# Patient Record
Sex: Female | Born: 1955 | Race: White | Hispanic: No | Smoking: Former smoker
Health system: Southern US, Community
[De-identification: ages and names within clinical notes are randomized; demographics above are authoritative.]

## PROBLEM LIST (undated history)

## (undated) DIAGNOSIS — M069 Rheumatoid arthritis, unspecified: Secondary | ICD-10-CM

## (undated) DIAGNOSIS — J449 Chronic obstructive pulmonary disease, unspecified: Secondary | ICD-10-CM

## (undated) DIAGNOSIS — I10 Essential (primary) hypertension: Secondary | ICD-10-CM

## (undated) HISTORY — PX: TONSILLECTOMY: SUR1361

## (undated) HISTORY — PX: ADENOIDECTOMY: SUR15

---

## 2011-02-16 DIAGNOSIS — I517 Cardiomegaly: Secondary | ICD-10-CM

## 2012-02-07 ENCOUNTER — Emergency Department (HOSPITAL_COMMUNITY)
Admission: EM | Admit: 2012-02-07 | Discharge: 2012-02-07 | Disposition: A | Discharge status: Home / Self Care | Payer: 59 | Attending: Emergency Medicine | Admitting: Emergency Medicine

## 2012-02-07 ENCOUNTER — Encounter (HOSPITAL_COMMUNITY): Payer: Self-pay | Admitting: *Deleted

## 2012-02-07 ENCOUNTER — Emergency Department (HOSPITAL_COMMUNITY): Payer: 59

## 2012-02-07 DIAGNOSIS — J4489 Other specified chronic obstructive pulmonary disease: Secondary | ICD-10-CM | POA: Insufficient documentation

## 2012-02-07 DIAGNOSIS — S79919A Unspecified injury of unspecified hip, initial encounter: Secondary | ICD-10-CM | POA: Insufficient documentation

## 2012-02-07 DIAGNOSIS — I10 Essential (primary) hypertension: Secondary | ICD-10-CM | POA: Insufficient documentation

## 2012-02-07 DIAGNOSIS — X500XXA Overexertion from strenuous movement or load, initial encounter: Secondary | ICD-10-CM | POA: Insufficient documentation

## 2012-02-07 DIAGNOSIS — Y9389 Activity, other specified: Secondary | ICD-10-CM | POA: Insufficient documentation

## 2012-02-07 DIAGNOSIS — J449 Chronic obstructive pulmonary disease, unspecified: Secondary | ICD-10-CM | POA: Insufficient documentation

## 2012-02-07 DIAGNOSIS — Z79899 Other long term (current) drug therapy: Secondary | ICD-10-CM | POA: Insufficient documentation

## 2012-02-07 DIAGNOSIS — Y929 Unspecified place or not applicable: Secondary | ICD-10-CM | POA: Insufficient documentation

## 2012-02-07 DIAGNOSIS — Z87891 Personal history of nicotine dependence: Secondary | ICD-10-CM | POA: Insufficient documentation

## 2012-02-07 DIAGNOSIS — M069 Rheumatoid arthritis, unspecified: Secondary | ICD-10-CM | POA: Insufficient documentation

## 2012-02-07 HISTORY — DX: Essential (primary) hypertension: I10

## 2012-02-07 HISTORY — DX: Chronic obstructive pulmonary disease, unspecified: J44.9

## 2012-02-07 HISTORY — DX: Rheumatoid arthritis, unspecified: M06.9

## 2012-02-07 MED ORDER — CYCLOBENZAPRINE HCL 10 MG PO TABS: 10.0000 mg | ORAL_TABLET | Freq: Two times a day (BID) | ORAL | Status: DC | PRN

## 2012-02-07 MED ORDER — OXYCODONE-ACETAMINOPHEN 7.5-500 MG PO TABS: 1.0000 | ORAL_TABLET | ORAL | Status: DC | PRN

## 2012-02-07 MED ORDER — IBUPROFEN 600 MG PO TABS: 600.0000 mg | ORAL_TABLET | Freq: Four times a day (QID) | ORAL | Status: DC | PRN

## 2012-02-07 NOTE — ED Notes (Signed)
Patient transported to X-ray 

## 2012-02-07 NOTE — ED Provider Notes (Signed)
History   This chart was scribed for Sara Shi, MD, by Frederik Pear, ER scribe. The patient was seen in room TR09C/TR09C and the patient's care was started at 0935.    CSN: [id number]  Arrival date & time 02/07/12  0935   None     Chief Complaint  Patient presents with  . Hip Pain    (Consider location/radiation/quality/duration/timing/severity/associated sxs/prior treatment) HPI  Sara Johnson is a 56 y.o. female who presents to the Emergency Department complaining of constant, severe right hip pain that is aggravated by movement and bearing weight and began this morning after she stepped off a step and heard a popping noise. She has no h/o of a hip replacement. She states that she currently has a wheelchair at home if needed.   Past Medical History  Diagnosis Date  . COPD (chronic obstructive pulmonary disease)   . RA (rheumatoid arthritis)   . Hypertension     Past Surgical History  Procedure Date  . Cesarean section   . Tonsillectomy     No family history on file.  History  Substance Use Topics  . Smoking status: Former Games developer  . Smokeless tobacco: Not on file  . Alcohol Use: No    OB History    Grav Para Term Preterm Abortions TAB SAB Ect Mult Living                  Review of Systems A complete 10 system review of systems was obtained and all systems are negative except as noted in the HPI and PMH.   Allergies  Penicillins and Sulfa antibiotics  Home Medications   Current Outpatient Rx  Name  Route  Sig  Dispense  Refill  . ENBREL [place]   Subcutaneous   Inject 1 Syringe into the skin once a week. Wednesdays         . FLUTICASONE-SALMETEROL 250-50 MCG/DOSE IN AEPB   Inhalation   Inhale 1 puff into the lungs every 12 (twelve) hours as needed. For shortness of breath         . FOLIC ACID PO   Oral   Take 2 tablets by mouth daily.         Marland Kitchen LISINOPRIL-HYDROCHLOROTHIAZIDE 20-25 MG PO TABS   Oral   Take 2 tablets by mouth daily.        Marland Kitchen METHOTREXATE 2.5 MG PO TABS   Oral   Take 20 mg by mouth once a week. Caution:Chemotherapy. Protect from light.  Wednesdays         . PREDNISONE 10 MG PO TABS   Oral   Take 10-20 mg by mouth See admin instructions. 12 day pack Ends on 1/4         . TIOTROPIUM BROMIDE MONOHYDRATE 18 MCG IN CAPS   Inhalation   Place 18 mcg into inhaler and inhale daily.         . CYCLOBENZAPRINE HCL 10 MG PO TABS   Oral   Take 1 tablet (10 mg total) by mouth 2 (two) times daily as needed for muscle spasms.   20 tablet   0   . IBUPROFEN 600 MG PO TABS   Oral   Take 1 tablet (600 mg total) by mouth every 6 (six) hours as needed for pain.   30 tablet   0   . OXYCODONE-ACETAMINOPHEN 7.5-500 MG PO TABS   Oral   Take 1 tablet by mouth every 4 (four) hours as needed for pain.  30 tablet   0     BP 141/79  Pulse 82  Temp 98.2 F (36.8 C) (Oral)  Resp 20  SpO2 97%  Physical Exam  Nursing note and vitals reviewed. Constitutional: She is oriented to person, place, and time. She appears well-developed and well-nourished. No distress.  HENT:  Head: Normocephalic and atraumatic.  Eyes: Pupils are equal, round, and reactive to light.  Neck: Normal range of motion.  Cardiovascular: Normal rate and intact distal pulses.   Pulmonary/Chest: No respiratory distress.  Abdominal: Normal appearance. She exhibits no distension.  Musculoskeletal:       Right hip: She exhibits decreased range of motion. She exhibits no bony tenderness, no crepitus and no deformity.       Legs: Neurological: She is alert and oriented to person, place, and time. No cranial nerve deficit.  Skin: Skin is warm and dry. No rash noted.  Psychiatric: She has a normal mood and affect. Her behavior is normal.    ED Course  Procedures (including critical care time)  DIAGNOSTIC STUDIES: Oxygen Saturation is 97% on room air, normal by my interpretation.    COORDINATION OF CARE:  09:55- Discussed planned  course of treatment with the patient, including a right hip X-ray, pain medication and a muscle relaxer, who is agreeable at this time.   Labs Reviewed - No data to display Dg Hip Complete Right  02/07/2012  *RADIOLOGY REPORT*  Clinical Data: Hip pain.  RIGHT HIP - COMPLETE 2+ VIEW  Comparison: None.  Findings: Mild to moderate degenerative changes in the hips bilaterally.  No fracture, subluxation or dislocation.  Lucency projects over the left superior acetabulum.  I suspect this represents overlying bowel gas rather than a lucent bone lesion.  SI joints are symmetric and grossly unremarkable.  IMPRESSION: No acute bony abnormality.  Symmetric mild to moderate degenerative changes.   Original Report Authenticated By: Charlett Nose, M.D.      1. Hip injury       MDM  I personally performed the services described in this documentation, which was scribed in my presence. The recorded information has been reviewed and considered.      Sara Shi, MD 02/07/12 [phone]

## 2012-02-07 NOTE — ED Notes (Signed)
Returned from xray

## 2012-02-07 NOTE — ED Notes (Signed)
Pt reports stepped of of step this am and heard a pop in her right hip area. Extreme pain with any movement/weight bearing. No hx of hip replacement.

## 2012-05-16 ENCOUNTER — Encounter (HOSPITAL_COMMUNITY): Payer: Self-pay | Admitting: Emergency Medicine

## 2012-05-16 ENCOUNTER — Emergency Department (HOSPITAL_COMMUNITY)
Admission: EM | Admit: 2012-05-16 | Discharge: 2012-05-16 | Disposition: A | Discharge status: Home / Self Care | Payer: Medicaid Other | Attending: Emergency Medicine | Admitting: Emergency Medicine

## 2012-05-16 DIAGNOSIS — M069 Rheumatoid arthritis, unspecified: Secondary | ICD-10-CM | POA: Insufficient documentation

## 2012-05-16 DIAGNOSIS — Z8739 Personal history of other diseases of the musculoskeletal system and connective tissue: Secondary | ICD-10-CM

## 2012-05-16 DIAGNOSIS — J449 Chronic obstructive pulmonary disease, unspecified: Secondary | ICD-10-CM | POA: Insufficient documentation

## 2012-05-16 DIAGNOSIS — M25539 Pain in unspecified wrist: Secondary | ICD-10-CM | POA: Insufficient documentation

## 2012-05-16 DIAGNOSIS — Z79899 Other long term (current) drug therapy: Secondary | ICD-10-CM | POA: Insufficient documentation

## 2012-05-16 DIAGNOSIS — IMO0002 Reserved for concepts with insufficient information to code with codable children: Secondary | ICD-10-CM | POA: Insufficient documentation

## 2012-05-16 DIAGNOSIS — M25579 Pain in unspecified ankle and joints of unspecified foot: Secondary | ICD-10-CM | POA: Insufficient documentation

## 2012-05-16 DIAGNOSIS — M25569 Pain in unspecified knee: Secondary | ICD-10-CM | POA: Insufficient documentation

## 2012-05-16 DIAGNOSIS — J4489 Other specified chronic obstructive pulmonary disease: Secondary | ICD-10-CM | POA: Insufficient documentation

## 2012-05-16 DIAGNOSIS — Z87891 Personal history of nicotine dependence: Secondary | ICD-10-CM | POA: Insufficient documentation

## 2012-05-16 DIAGNOSIS — I10 Essential (primary) hypertension: Secondary | ICD-10-CM | POA: Insufficient documentation

## 2012-05-16 DIAGNOSIS — M2550 Pain in unspecified joint: Secondary | ICD-10-CM

## 2012-05-16 MED ORDER — OXYCODONE-ACETAMINOPHEN 5-325 MG PO TABS
2.0000 | ORAL_TABLET | Freq: Once | ORAL | Status: AC
  Administered 2012-05-16: 2 via ORAL
  Filled 2012-05-16: qty 2

## 2012-05-16 MED ORDER — OXYCODONE-ACETAMINOPHEN 7.5-325 MG PO TABS: 1.0000 | ORAL_TABLET | ORAL | Status: AC | PRN

## 2012-05-16 NOTE — ED Notes (Signed)
Pt dc'd w/all her belongings, alert upon dc, pt is ambulatory but causes increase pain, pt prescribed 1 new rx, driven home by daughter, pt verbalizes understanding of dc instructions.

## 2012-05-16 NOTE — ED Notes (Signed)
Pt reports chronic pain in her right wrist, knee and ankle. States has been on prednisone for her RA. Denies recent injury/trauma. States has been up for the last 2 nights with pain. Pt MAE. Distal circulation intact.

## 2012-05-16 NOTE — ED Provider Notes (Signed)
History     CSN: [id number]  Arrival date & time 05/16/12  1051   First MD Initiated Contact with Patient 05/16/12 1105      Chief Complaint  Patient presents with  . Extremity Pain    (Consider location/radiation/quality/duration/timing/severity/associated sxs/prior treatment) HPI Comments: Patient with a history of RA presents today with a chief complaint of pain in her right wrist, knee, and ankle.  She reports that the pain has been present for several years, but worsened two days ago.  No acute injury or trauma.  She is currently on Prednisone for her RA, but does not feel that it is helping.  She was followed by a Rheumatologist in [place] in the past, but she has lost her insurance and can no longer see her Rheumatologist.  She denies any redness or swelling of the joints.  She has full ROM of all joints.  She reports that in the past she has taken Oxycodone for her pain, which has helped.    The history is provided by the patient.    Past Medical History  Diagnosis Date  . COPD (chronic obstructive pulmonary disease)   . RA (rheumatoid arthritis)   . Hypertension     Past Surgical History  Procedure Laterality Date  . Cesarean section    . Tonsillectomy      History reviewed. No pertinent family history.  History  Substance Use Topics  . Smoking status: Former Games developer  . Smokeless tobacco: Not on file  . Alcohol Use: No    OB History   Grav Para Term Preterm Abortions TAB SAB Ect Mult Living                  Review of Systems  Constitutional: Negative for fever and chills.  Musculoskeletal:       Right knee, wrist, and ankle pain  All other systems reviewed and are negative.    Allergies  Penicillins and Sulfa antibiotics  Home Medications   Current Outpatient Rx  Name  Route  Sig  Dispense  Refill  . calcium carbonate (OS-CAL - DOSED IN MG OF ELEMENTAL CALCIUM) 1250 MG tablet   Oral   Take 1 tablet by mouth daily.         . clonazePAM  (KLONOPIN) 1 MG tablet   Oral   Take 1 mg by mouth 2 (two) times daily as needed for anxiety.         . Fluticasone-Salmeterol (ADVAIR) 250-50 MCG/DOSE AEPB   Inhalation   Inhale 1 puff into the lungs every 12 (twelve) hours as needed. For shortness of breath         . folic acid (FOLVITE) 1 MG tablet   Oral   Take 2 mg by mouth daily.         Marland Kitchen lisinopril-hydrochlorothiazide (PRINZIDE,ZESTORETIC) 20-25 MG per tablet   Oral   Take 2 tablets by mouth daily.         . Multiple Vitamin (MULTIVITAMIN WITH MINERALS) TABS   Oral   Take 1 tablet by mouth daily.         . predniSONE (DELTASONE) 20 MG tablet   Oral   Take 20 mg by mouth daily.         Marland Kitchen tiotropium (SPIRIVA) 18 MCG inhalation capsule   Inhalation   Place 18 mcg into inhaler and inhale daily.           BP 140/66  Pulse 85  Temp(Src) 98.9 F (37.2 C) (  Oral)  Resp 22  SpO2 96%  Physical Exam  Nursing note and vitals reviewed. Constitutional: She appears well-developed and well-nourished.  HENT:  Head: Normocephalic and atraumatic.  Cardiovascular: Normal rate, regular rhythm and normal heart sounds.   Pulmonary/Chest: Effort normal and breath sounds normal.  Musculoskeletal:  No erythema, edema, or warmth of the joints. Full ROM of the right wrist, knee and ankle  Neurological: She is alert.  Skin: Skin is warm and dry.  Psychiatric: She has a normal mood and affect.    ED Course  Procedures (including critical care time)  Labs Reviewed - No data to display No results found.   No diagnosis found.  Looked up patient in the Narcotic database.  No recent narcotic prescriptions.  MDM  Patient with history of RA and chronic joint pain presents with a chief complaint of pain in her right wrist, ankle, and knee.  Patient is afebrile.  Full ROM of right wrist, ankle, and knee.  No erythema, edema, or other signs of infection.  Patient given short course of pain medication and instructed to  follow up with PCP.        Pascal Lux Gantt, PA-C 05/16/12 2037

## 2012-05-16 NOTE — ED Notes (Signed)
Pt c/o joint pain to (R) wrist, ankle, and knee. Pt reports hx of chronic pain d/t RA. Pt reports increase pain the last few days. Pt is currently waiting for medicaid and does not a RA specialist

## 2012-05-17 NOTE — ED Provider Notes (Signed)
Medical screening examination/treatment/procedure(s) were performed by non-physician practitioner and as supervising physician I was immediately available for consultation/collaboration.   Deserai Cansler B. Bradshaw Minihan, MD 05/17/12 0934 

## 2012-05-21 ENCOUNTER — Other Ambulatory Visit: Payer: Self-pay

## 2012-05-21 ENCOUNTER — Encounter (HOSPITAL_COMMUNITY): Payer: Self-pay

## 2012-05-21 ENCOUNTER — Emergency Department (HOSPITAL_COMMUNITY): Payer: Medicaid Other

## 2012-05-21 ENCOUNTER — Emergency Department (HOSPITAL_COMMUNITY)
Admission: EM | Admit: 2012-05-21 | Discharge: 2012-05-21 | Disposition: A | Discharge status: Home / Self Care | Payer: Medicaid Other | Attending: Emergency Medicine | Admitting: Emergency Medicine

## 2012-05-21 DIAGNOSIS — R5381 Other malaise: Secondary | ICD-10-CM | POA: Insufficient documentation

## 2012-05-21 DIAGNOSIS — IMO0002 Reserved for concepts with insufficient information to code with codable children: Secondary | ICD-10-CM | POA: Insufficient documentation

## 2012-05-21 DIAGNOSIS — M069 Rheumatoid arthritis, unspecified: Secondary | ICD-10-CM | POA: Insufficient documentation

## 2012-05-21 DIAGNOSIS — R0602 Shortness of breath: Secondary | ICD-10-CM | POA: Insufficient documentation

## 2012-05-21 DIAGNOSIS — R5383 Other fatigue: Secondary | ICD-10-CM | POA: Insufficient documentation

## 2012-05-21 DIAGNOSIS — I10 Essential (primary) hypertension: Secondary | ICD-10-CM | POA: Insufficient documentation

## 2012-05-21 DIAGNOSIS — R079 Chest pain, unspecified: Secondary | ICD-10-CM | POA: Insufficient documentation

## 2012-05-21 DIAGNOSIS — Z79899 Other long term (current) drug therapy: Secondary | ICD-10-CM | POA: Insufficient documentation

## 2012-05-21 DIAGNOSIS — R52 Pain, unspecified: Secondary | ICD-10-CM | POA: Insufficient documentation

## 2012-05-21 DIAGNOSIS — J441 Chronic obstructive pulmonary disease with (acute) exacerbation: Secondary | ICD-10-CM | POA: Insufficient documentation

## 2012-05-21 DIAGNOSIS — R0609 Other forms of dyspnea: Secondary | ICD-10-CM | POA: Insufficient documentation

## 2012-05-21 DIAGNOSIS — R0989 Other specified symptoms and signs involving the circulatory and respiratory systems: Secondary | ICD-10-CM | POA: Insufficient documentation

## 2012-05-21 DIAGNOSIS — J449 Chronic obstructive pulmonary disease, unspecified: Secondary | ICD-10-CM

## 2012-05-21 DIAGNOSIS — R0600 Dyspnea, unspecified: Secondary | ICD-10-CM

## 2012-05-21 DIAGNOSIS — Z87891 Personal history of nicotine dependence: Secondary | ICD-10-CM | POA: Insufficient documentation

## 2012-05-21 LAB — BASIC METABOLIC PANEL
BUN: 20 mg/dL (ref 6–23)
Chloride: 92 mEq/L — ABNORMAL LOW (ref 96–112)
Glucose, Bld: 120 mg/dL — ABNORMAL HIGH (ref 70–99)
Potassium: 4 mEq/L (ref 3.5–5.1)

## 2012-05-21 LAB — D-DIMER, QUANTITATIVE: D-Dimer, Quant: 15.73 ug/mL-FEU — ABNORMAL HIGH (ref 0.00–0.48)

## 2012-05-21 LAB — CBC WITH DIFFERENTIAL/PLATELET
Eosinophils Absolute: 0.2 10*3/uL (ref 0.0–0.7)
HCT: 42 % (ref 36.0–46.0)
Hemoglobin: 13.4 g/dL (ref 12.0–15.0)
Lymphs Abs: 1.5 10*3/uL (ref 0.7–4.0)
MCH: 30.9 pg (ref 26.0–34.0)
Monocytes Relative: 7 % (ref 3–12)
Neutrophils Relative %: 75 % (ref 43–77)
RBC: 4.33 MIL/uL (ref 3.87–5.11)

## 2012-05-21 MED ORDER — ALBUTEROL SULFATE (5 MG/ML) 0.5% IN NEBU
5.0000 mg | INHALATION_SOLUTION | Freq: Once | RESPIRATORY_TRACT | Status: AC
  Administered 2012-05-21: 5 mg via RESPIRATORY_TRACT
  Filled 2012-05-21: qty 1

## 2012-05-21 MED ORDER — IOHEXOL 350 MG/ML SOLN
100.0000 mL | Freq: Once | INTRAVENOUS | Status: AC | PRN
  Administered 2012-05-21: 100 mL via INTRAVENOUS

## 2012-05-21 MED ORDER — ALBUTEROL SULFATE HFA 108 (90 BASE) MCG/ACT IN AERS
2.0000 | INHALATION_SPRAY | RESPIRATORY_TRACT | Status: DC | PRN
  Administered 2012-05-21: 2 via RESPIRATORY_TRACT
  Filled 2012-05-21: qty 6.7

## 2012-05-21 MED ORDER — SODIUM CHLORIDE 0.9 % IV SOLN
1000.0000 mL | Freq: Once | INTRAVENOUS | Status: AC
  Administered 2012-05-21: 1000 mL via INTRAVENOUS

## 2012-05-21 MED ORDER — MORPHINE SULFATE 4 MG/ML IJ SOLN
6.0000 mg | Freq: Once | INTRAMUSCULAR | Status: AC
  Administered 2012-05-21: 6 mg via INTRAVENOUS
  Filled 2012-05-21: qty 2

## 2012-05-21 MED ORDER — IPRATROPIUM BROMIDE 0.02 % IN SOLN
0.5000 mg | Freq: Once | RESPIRATORY_TRACT | Status: AC
  Administered 2012-05-21: 0.5 mg via RESPIRATORY_TRACT
  Filled 2012-05-21: qty 2.5

## 2012-05-21 MED ORDER — OXYCODONE HCL 5 MG PO TABS: 5.0000 mg | ORAL_TABLET | ORAL | Status: AC | PRN

## 2012-05-21 MED ORDER — SODIUM CHLORIDE 0.9 % IV SOLN: 1000.0000 mL | INTRAVENOUS | Status: DC

## 2012-05-21 NOTE — ED Notes (Signed)
C/o generalized pain over [redacted] week along with chest pain and increase of sob as well. Pt states she was seen last week for same and given pain rx and sent home. Able to speak complete sentences. Pt wears home O2 at 3L.

## 2012-05-21 NOTE — ED Notes (Signed)
nad noted prior to dc. Pt will be dc from ER but will have U/s done in am at 0700. Per [employer] pt able to stay in ER room until am. Family with pt

## 2012-05-21 NOTE — ED Provider Notes (Signed)
History     CSN: [id number]  Arrival date & time 05/21/12  1742   First MD Initiated Contact with Patient 05/21/12 1746      Chief Complaint  Patient presents with  . Generalized Body Aches  . Chest Pain     The history is provided by the patient.   patient reports generalized pain over the last one week with increasing chest pain and shortness of breath over the past 3-4 days.  The chest pain is intermittent and sharp.  She states her shortness of breath is worsening.  No nausea or vomiting.  No diaphoresis.  No known history of coronary artery disease.  She states she had a heart catheterization done many years ago [facility] and she did not receive stents at that time.  She does not have a history of congestive heart failure.  No fevers or chills.  She's on 3 L home O2 for COPD.  She also has a history of rheumatoid arthritis and the majority of her pain is localized to her bilateral knees and her back.  She takes 7.5 mg oxycodone at home for chronic pain.  She used to smoke cigarettes but no longer does.  No history of DVT or pulmonary embolism.  Her symptoms are moderate in severity.  Nothing worsens or improves her symptoms.  She does report some new dyspnea on exertion.  She always sits in an upright position therefore is unable to describe if there is any new or worsening orthopnea.  Family reports also generalized weakness and the patient states that her pain has been too severe for her to get up and move around.  She denies unilateral leg swelling but does report that her bilateral legs seem to be swelling more.  Past Medical History  Diagnosis Date  . COPD (chronic obstructive pulmonary disease)   . RA (rheumatoid arthritis)   . Hypertension     Past Surgical History  Procedure Laterality Date  . Cesarean section    . Tonsillectomy      No family history on file.  History  Substance Use Topics  . Smoking status: Former Games developer  . Smokeless tobacco: Not on file  .  Alcohol Use: No    OB History   Grav Para Term Preterm Abortions TAB SAB Ect Mult Living                  Review of Systems  All other systems reviewed and are negative.    Allergies  Penicillins and Sulfa antibiotics  Home Medications   Current Outpatient Rx  Name  Route  Sig  Dispense  Refill  . calcium carbonate (OS-CAL - DOSED IN MG OF ELEMENTAL CALCIUM) 1250 MG tablet   Oral   Take 1 tablet by mouth daily.         . clonazePAM (KLONOPIN) 1 MG tablet   Oral   Take 1 mg by mouth 2 (two) times daily as needed for anxiety.         . Fluticasone-Salmeterol (ADVAIR) 250-50 MCG/DOSE AEPB   Inhalation   Inhale 1 puff into the lungs every 12 (twelve) hours as needed. For shortness of breath         . folic acid (FOLVITE) 1 MG tablet   Oral   Take 2 mg by mouth daily.         Marland Kitchen lisinopril-hydrochlorothiazide (PRINZIDE,ZESTORETIC) 20-25 MG per tablet   Oral   Take 2 tablets by mouth daily.         Marland Kitchen  Multiple Vitamin (MULTIVITAMIN WITH MINERALS) TABS   Oral   Take 1 tablet by mouth daily.         Marland Kitchen oxyCODONE-acetaminophen (PERCOCET) 7.5-325 MG per tablet   Oral   Take 1 tablet by mouth every 4 (four) hours as needed for pain.   20 tablet   0   . predniSONE (DELTASONE) 20 MG tablet   Oral   Take 40 mg by mouth daily.          Marland Kitchen tiotropium (SPIRIVA) 18 MCG inhalation capsule   Inhalation   Place 18 mcg into inhaler and inhale daily.           BP 102/50  Pulse 86  Temp(Src) 98.9 F (37.2 C) (Oral)  Resp 36  Ht 5\' 5"  (1.651 m)  Wt 340 lb (154.223 kg)  BMI 56.58 kg/m2  SpO2 94%  Physical Exam  Nursing note and vitals reviewed. Constitutional: She is oriented to person, place, and time. She appears well-developed and well-nourished. No distress.  Morbidly obese  HENT:  Head: Normocephalic and atraumatic.  Eyes: EOM are normal.  Neck: Normal range of motion.  Cardiovascular: Normal rate, regular rhythm and normal heart sounds.    Pulmonary/Chest: Effort normal.  Decreased breath sounds in the bases.  No overt rhonchi or wheezes noted.  Abdominal: Soft. She exhibits no distension. There is no tenderness.  Musculoskeletal: Normal range of motion.  2+ pitting edema bilaterally.  No unilateral leg swelling.  Neurological: She is alert and oriented to person, place, and time.  Skin: Skin is warm and dry.  Psychiatric: She has a normal mood and affect. Judgment normal.    ED Course  Procedures (including critical care time)   Date: 05/21/2012  Rate: 82  Rhythm: normal sinus rhythm  QRS Axis: normal  Intervals: normal  ST/T Wave abnormalities: normal  Conduction Disutrbances: none  Narrative Interpretation:   Old EKG Reviewed: No significant changes noted     Labs Reviewed  BASIC METABOLIC PANEL - Abnormal; Notable for the following:    Chloride 92 (*)    CO2 37 (*)    Glucose, Bld 120 (*)    Creatinine, Ser 0.49 (*)    All other components within normal limits  D-DIMER, QUANTITATIVE - Abnormal; Notable for the following:    D-Dimer, Quant 15.73 (*)    All other components within normal limits  CBC WITH DIFFERENTIAL  TROPONIN I  PRO B NATRIURETIC PEPTIDE   Ct Angio Chest W/cm &/or Wo Cm  05/21/2012  *RADIOLOGY REPORT*  Clinical Data: Pain and increasing shortness of breath.  CT ANGIOGRAPHY CHEST  Technique:  Multidetector CT imaging of the chest using the standard protocol during bolus administration of intravenous contrast. Multiplanar reconstructed images including MIPs were obtained and reviewed to evaluate the vascular anatomy.  Contrast: OMNIPAQUE IOHEXOL 350 MG/ML SOLN  Comparison: Chest x-ray dated 05/21/2012 and CT angiogram dated 02/16/2011  Findings: There are no pulmonary emboli.  Heart size is normal. Moderate calcification in the proximal left anterior descending artery.  Minimal linear atelectasis at the lung bases posteriorly.  No infiltrates or effusions.  No acute osseous  abnormality.  IMPRESSION: Minimal atelectasis at the lung bases.  No pulmonary emboli.   Original Report Authenticated By: Francene Boyers, M.D.    Dg Chest Portable 1 View  05/21/2012  *RADIOLOGY REPORT*  Clinical Data: Chest pain, shortness of breath and body aches.  PORTABLE CHEST - 1 VIEW  Comparison:  PA and lateral chest 05/25/2011.  Findings: The study is limited by the patient's body habitus and portable technique.  Lungs appear grossly clear.  Heart size normal.  No pneumothorax or pleural fluid.  IMPRESSION: No acute disease.   Original Report Authenticated By: Holley Dexter, M.D.    I personally reviewed the imaging tests through PACS system I reviewed available ER/hospitalization records through the EMR   1. Dyspnea   2. COPD (chronic obstructive pulmonary disease)   3. Generalized pain       MDM  Unclear etiology of the patient's shortness of breath.  CT angiogram is pending at this time.  Her d-dimer was 15.  She does have oxygen dependent COPD and some this may represent COPD exacerbation.  She did feel somewhat better after initial treatment with albuterol and Atrovent.  Her BNP is normal no show signs of congestive heart failure on x-ray.  10:02 PM The patient feels much better at this time.  CT angiogram is negative for pulmonary embolism.  Her breathing is much improved.  Home with an albuterol inhaler.  She's no longer tachypnea after breathing treatment.  Her pain is significantly improved.  Her pain is more likely chronic arthritis and related to her rheumatoid arthritis.  Given her bilateral lower extremity swelling as well as her elevated d-dimer she will be asked to return in the morning to any pain hospital to receive duplex ultrasound of her bilateral lower extremities to evaluate for deep vein thrombosis.  Close PCP followup.  Home with oxycodone that she can take in addition to the Percocet she started taking.  The patient is agreeable to outpatient  plan          Lyanne Co, MD 05/21/12 2203

## 2012-05-22 ENCOUNTER — Other Ambulatory Visit (HOSPITAL_COMMUNITY): Payer: 59

## 2012-05-22 ENCOUNTER — Ambulatory Visit (HOSPITAL_COMMUNITY)
Admit: 2012-05-22 | Discharge: 2012-05-22 | Disposition: A | Discharge status: Home / Self Care | Payer: Medicaid Other | Source: Ambulatory Visit | Attending: Emergency Medicine | Admitting: Emergency Medicine

## 2012-05-22 DIAGNOSIS — M7120 Synovial cyst of popliteal space [Baker], unspecified knee: Secondary | ICD-10-CM | POA: Insufficient documentation

## 2012-05-22 DIAGNOSIS — M7989 Other specified soft tissue disorders: Secondary | ICD-10-CM | POA: Insufficient documentation

## 2012-05-22 DIAGNOSIS — M79609 Pain in unspecified limb: Secondary | ICD-10-CM | POA: Insufficient documentation

## 2012-05-22 NOTE — ED Provider Notes (Signed)
Pt here for DVT study followup DVT Study negative She requested paper copy to give to her PCP in [place] I advised she can return at any time for worsening of her symptoms  Joya Gaskins, MD 05/22/12 [phone]

## 2013-11-01 IMAGING — CT CT ANGIO CHEST
1 of 6 series · 5 of 36 positions shown · IV contrast (Omnipaque 300)
Comparison: Chest x-ray dated 05/21/2012 and CT angiogram dated
02/16/2011

CLINICAL DATA: Pain and increasing shortness of breath.

CT ANGIOGRAPHY CHEST
TECHNIQUE: Multidetector CT imaging of the chest using the
standard protocol during bolus administration of intravenous
contrast. Multiplanar reconstructed images including MIPs were
obtained and reviewed to evaluate the vascular anatomy.
Contrast: 100mL OMNIPAQUE IOHEXOL 350 MG/ML SOLN

[Series 4: pe 3.0 b40f · axial · 0.76mm/px · z∈[-288,-72]mm · 5 of 108 slices shown]
[im 18/108  lung]
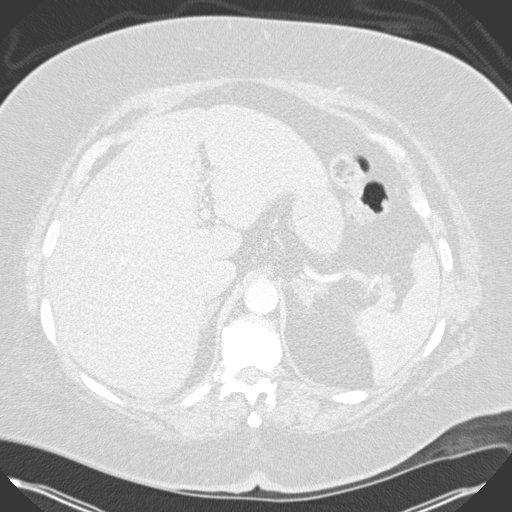
[im 36/108  mediastinal]
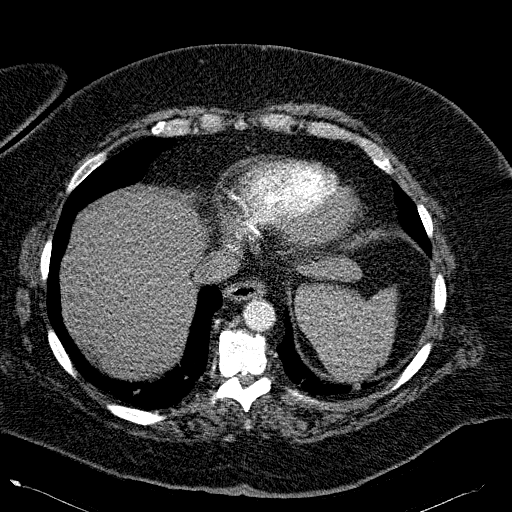
[im 54/108  lung]
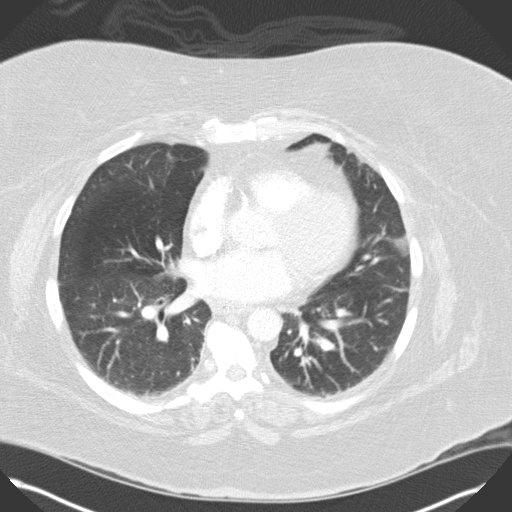
[im 72/108  mediastinal]
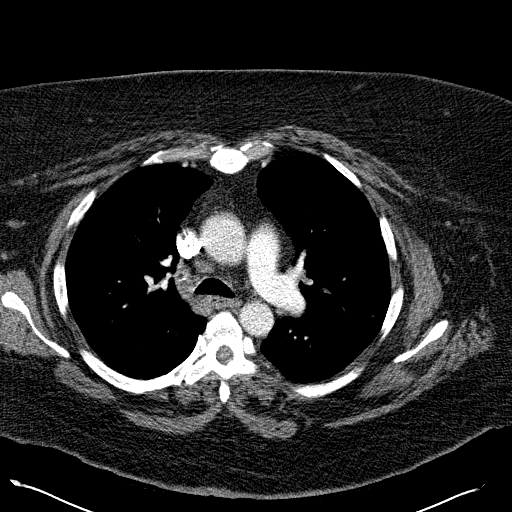
[im 90/108  lung]
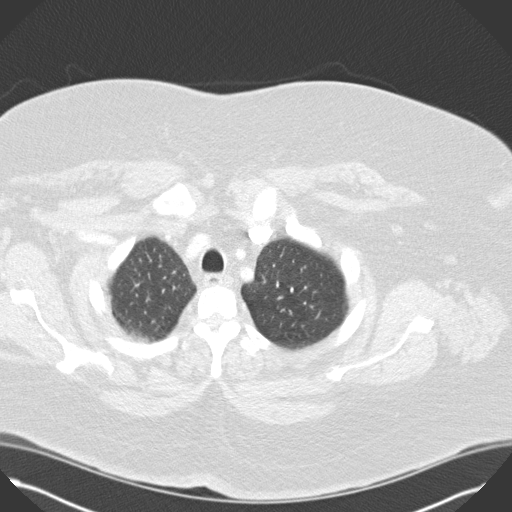

[5 of 36 positions shown; findings below may reference images not displayed]

FINDINGS: There are no pulmonary emboli.  Heart size is normal.
Moderate calcification in the proximal left anterior descending
artery.

Minimal linear atelectasis at the lung bases posteriorly.  No
infiltrates or effusions.  No acute osseous abnormality.
IMPRESSION: Minimal atelectasis at the lung bases.  No pulmonary emboli.

## 2016-06-21 ENCOUNTER — Encounter (HOSPITAL_COMMUNITY): Payer: Self-pay

## 2016-06-21 ENCOUNTER — Emergency Department (HOSPITAL_COMMUNITY)
Admission: EM | Admit: 2016-06-21 | Discharge: 2016-06-21 | Disposition: A | Discharge status: Home / Self Care | Payer: Medicare Other | Attending: Emergency Medicine | Admitting: Emergency Medicine

## 2016-06-21 DIAGNOSIS — S81812A Laceration without foreign body, left lower leg, initial encounter: Secondary | ICD-10-CM | POA: Insufficient documentation

## 2016-06-21 DIAGNOSIS — Y929 Unspecified place or not applicable: Secondary | ICD-10-CM | POA: Insufficient documentation

## 2016-06-21 DIAGNOSIS — I10 Essential (primary) hypertension: Secondary | ICD-10-CM | POA: Insufficient documentation

## 2016-06-21 DIAGNOSIS — Z87891 Personal history of nicotine dependence: Secondary | ICD-10-CM | POA: Diagnosis not present

## 2016-06-21 DIAGNOSIS — Y999 Unspecified external cause status: Secondary | ICD-10-CM | POA: Diagnosis not present

## 2016-06-21 DIAGNOSIS — W2209XA Striking against other stationary object, initial encounter: Secondary | ICD-10-CM | POA: Diagnosis not present

## 2016-06-21 DIAGNOSIS — Z79899 Other long term (current) drug therapy: Secondary | ICD-10-CM | POA: Insufficient documentation

## 2016-06-21 DIAGNOSIS — J449 Chronic obstructive pulmonary disease, unspecified: Secondary | ICD-10-CM | POA: Diagnosis not present

## 2016-06-21 DIAGNOSIS — Z23 Encounter for immunization: Secondary | ICD-10-CM | POA: Diagnosis not present

## 2016-06-21 DIAGNOSIS — Y9389 Activity, other specified: Secondary | ICD-10-CM | POA: Insufficient documentation

## 2016-06-21 MED ORDER — CEPHALEXIN 500 MG PO CAPS
500.0000 mg | ORAL_CAPSULE | Freq: Once | ORAL | Status: AC
  Administered 2016-06-21: 500 mg via ORAL
  Filled 2016-06-21: qty 1

## 2016-06-21 MED ORDER — CEPHALEXIN 500 MG PO CAPS: 500.0000 mg | ORAL_CAPSULE | Freq: Four times a day (QID) | ORAL | 0 refills | Status: AC

## 2016-06-21 MED ORDER — TETANUS-DIPHTH-ACELL PERTUSSIS 5-2.5-18.5 LF-MCG/0.5 IM SUSP
0.5000 mL | Freq: Once | INTRAMUSCULAR | Status: AC
  Administered 2016-06-21: 0.5 mL via INTRAMUSCULAR
  Filled 2016-06-21: qty 0.5

## 2016-06-21 NOTE — Discharge Instructions (Signed)
Clean the wound with mild soap and water and keep it bandaged.  Change the dressing daily.  Elevate your leg when possible and minimize walking and or standing.  Follow-up with your doctor in 2-3 days for recheck or return here for recheck.

## 2016-06-21 NOTE — ED Triage Notes (Addendum)
Pt reports that she fell while trying to get in bed last night, but bed was too high and she fell  and hit left lower leg on bed frame.  Pt reports not on blood thinners. Not currently bleeding at this time

## 2016-06-21 NOTE — ED Provider Notes (Signed)
[facility] DEPT Provider Note   CSN: [id number] Arrival date & time: 06/21/16  1314     History   Chief Complaint Chief Complaint  Patient presents with  . Fall  . Extremity Laceration    HPI Sara Johnson is a 61 y.o. female.  HPI   Sara Johnson is a 61 y.o. female who presents to the Emergency Department complaining of laceration to left lower leg that occurred last evening at approximately 9:00 pm.  She states that she accidentally struck her leg on a metal bed frame.  Patient's family member applied OTC liquid bandage last evening and bandaged the wound.  She states that she did not have transportation here until this afternoon.  She reports pain with weight bearing.  She denies excessive bleeding, redness or swelling.  Denies other injuries.  Pt does not take anti-coagulants.  Last Td is unknown   Past Medical History:  Diagnosis Date  . COPD (chronic obstructive pulmonary disease) (HCC)    wears chronic O2  . Hypertension   . RA (rheumatoid arthritis) (HCC)     There are no active problems to display for this patient.   Past Surgical History:  Procedure Laterality Date  . ADENOIDECTOMY    . CESAREAN SECTION    . TONSILLECTOMY      OB History    No data available       Home Medications    Prior to Admission medications   Medication Sig Start Date End Date Taking? Authorizing Provider  calcium carbonate (OS-CAL - DOSED IN MG OF ELEMENTAL CALCIUM) 1250 MG tablet Take 1 tablet by mouth daily.    [provider]  clonazePAM (KLONOPIN) 1 MG tablet Take 1 mg by mouth 2 (two) times daily as needed for anxiety.    [provider]  Fluticasone-Salmeterol (ADVAIR) 250-50 MCG/DOSE AEPB Inhale 1 puff into the lungs every 12 (twelve) hours as needed. For shortness of breath    [provider]  folic acid (FOLVITE) 1 MG tablet Take 2 mg by mouth daily.    [provider]  lisinopril-hydrochlorothiazide (PRINZIDE,ZESTORETIC)  20-25 MG per tablet Take 2 tablets by mouth daily.    [provider]  Multiple Vitamin (MULTIVITAMIN WITH MINERALS) TABS Take 1 tablet by mouth daily.    [provider]  oxyCODONE (ROXICODONE) 5 MG immediate release tablet Take 1 tablet (5 mg total) by mouth every 4 (four) hours as needed for pain. 05/21/12   Azalia Bilis, MD  oxyCODONE-acetaminophen (PERCOCET) 7.5-325 MG per tablet Take 1 tablet by mouth every 4 (four) hours as needed for pain. 05/16/12   Santiago Glad, PA-C  predniSONE (DELTASONE) 20 MG tablet Take 40 mg by mouth daily.     [provider]  tiotropium (SPIRIVA) 18 MCG inhalation capsule Place 18 mcg into inhaler and inhale daily.    [provider]    Family History No family history on file.  Social History Social History  Substance Use Topics  . Smoking status: Former Games developer  . Smokeless tobacco: Never Used  . Alcohol use No     Allergies   Meloxicam; Penicillins; and Sulfa antibiotics   Review of Systems Review of Systems  Constitutional: Negative for chills and fever.  Musculoskeletal: Negative for arthralgias, back pain and joint swelling.  Skin: Positive for wound.       Laceration left lower leg  Neurological: Negative for dizziness, weakness and numbness.  Hematological: Negative for adenopathy. Does not bruise/bleed easily.  All other  systems reviewed and are negative.    Physical Exam Updated Vital Signs BP (!) 125/48   Pulse 84   Temp 98.1 F (36.7 C) (Oral)   Resp (!) 22   Ht 5\' 6"  (1.676 m)   Wt (!) 154.2 kg   SpO2 93%   BMI 54.88 kg/m   Physical Exam  Constitutional: She is oriented to person, place, and time. She appears well-developed and well-nourished. No distress.  HENT:  Head: Normocephalic and atraumatic.  Mouth/Throat: Oropharynx is clear and moist.  Cardiovascular: Normal rate, regular rhythm, normal heart sounds and intact distal pulses.   No murmur heard. Pulmonary/Chest: Effort  normal and breath sounds normal. No respiratory distress.  Musculoskeletal: Normal range of motion. She exhibits no edema or tenderness.  Neurological: She is alert and oriented to person, place, and time. No sensory deficit. She exhibits normal muscle tone. Coordination normal.  Skin: Skin is warm. Capillary refill takes less than 2 seconds. Laceration noted.  Laceration to the anterior left lower leg.  No hematoma.  No active bleeding.    Nursing note and vitals reviewed.    ED Treatments / Results  Labs (all labs ordered are listed, but only abnormal results are displayed) Labs Reviewed - No data to display  EKG  EKG Interpretation None       Radiology No results found.  Procedures Procedures (including critical care time)  Medications Ordered in ED Medications  Tdap (BOOSTRIX) injection 0.5 mL (0.5 mLs Intramuscular Given 06/21/16 1724)  cephALEXin (KEFLEX) capsule 500 mg (500 mg Oral Given 06/21/16 1723)     Initial Impression / Assessment and Plan / ED Course  I have reviewed the triage vital signs and the nursing notes.  Pertinent labs & imaging results that were available during my care of the patient were reviewed by me and considered in my medical decision making (see chart for details).     Pt also seen by Dr. Estell HarpinZammit and care plan discussed.  Since lac is > 18 hrs old, it is felt best to allow the wound to heal by secondary intent.    Wound was thoroughly cleaned and irrigated by me using saline and betadine solution and bandaged with xeroform gauze.  rx for abx.  Return precautions discussed.  Td is updated.  Final Clinical Impressions(s) / ED Diagnoses   Final diagnoses:  Laceration of left lower leg, initial encounter    New Prescriptions New Prescriptions   No medications on file     Sara Johnson, Sara Johnson, Sara Johnson 06/26/16 1209    Bethann BerkshireZammit, Joseph, MD 06/28/16 1451

## 2016-09-22 ENCOUNTER — Encounter (HOSPITAL_BASED_OUTPATIENT_CLINIC_OR_DEPARTMENT_OTHER): Payer: Medicare Other

## 2017-04-08 DEATH — deceased
